# Patient Record
Sex: Female | Born: 2000 | Race: Black or African American | Hispanic: No | Marital: Single | Smoking: Never smoker
Health system: Southern US, Community
[De-identification: ages and names within clinical notes are randomized; demographics above are authoritative.]

## PROBLEM LIST (undated history)

## (undated) DIAGNOSIS — F419 Anxiety disorder, unspecified: Secondary | ICD-10-CM

## (undated) HISTORY — DX: Anxiety disorder, unspecified: F41.9

---
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
## 2020-04-07 IMAGING — DX DG ANKLE COMPLETE 3+V*L*
3 series · 3 of 3 positions shown · non-contrast
Comparison: None.

CLINICAL DATA: Pt c/o continuing left ankle pain and tenderness
since injury [REDACTED]. Pt denies any re injury or trauma to left ankle

EXAM:
LEFT ANKLE COMPLETE - 3+ VIEW

[ankle ap]
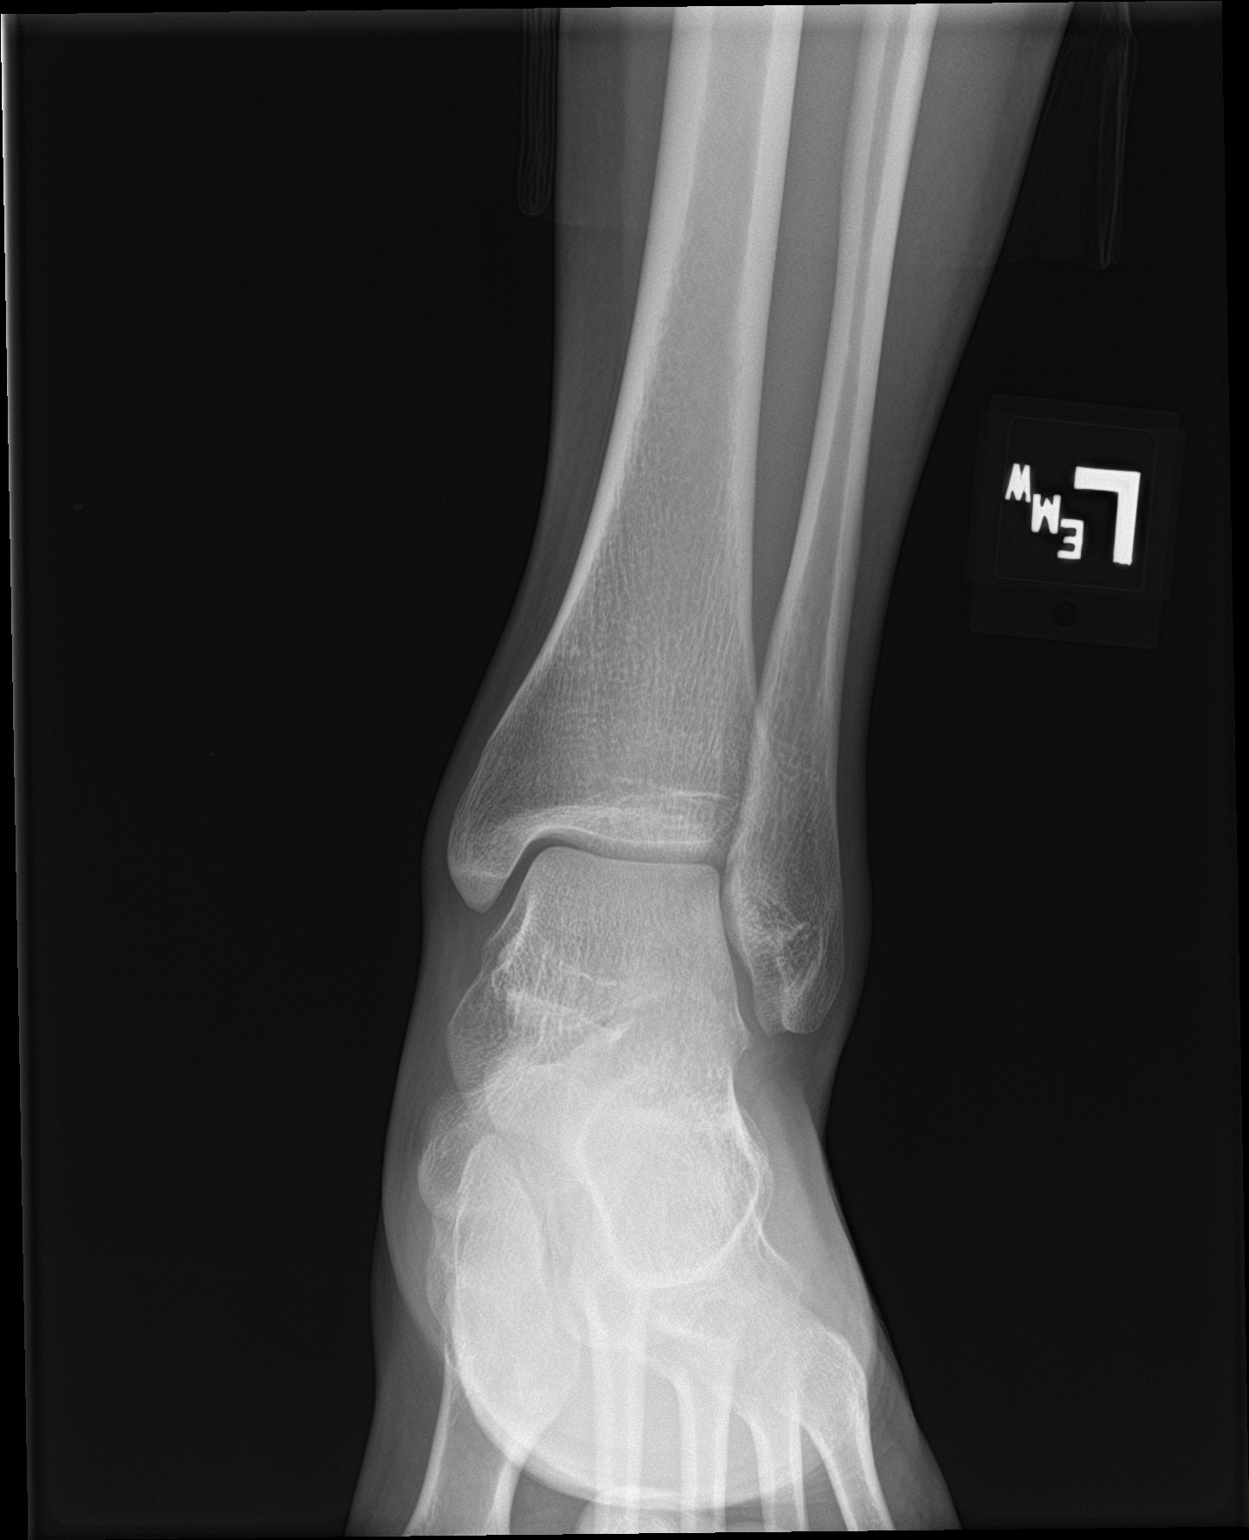

[ankle obl]
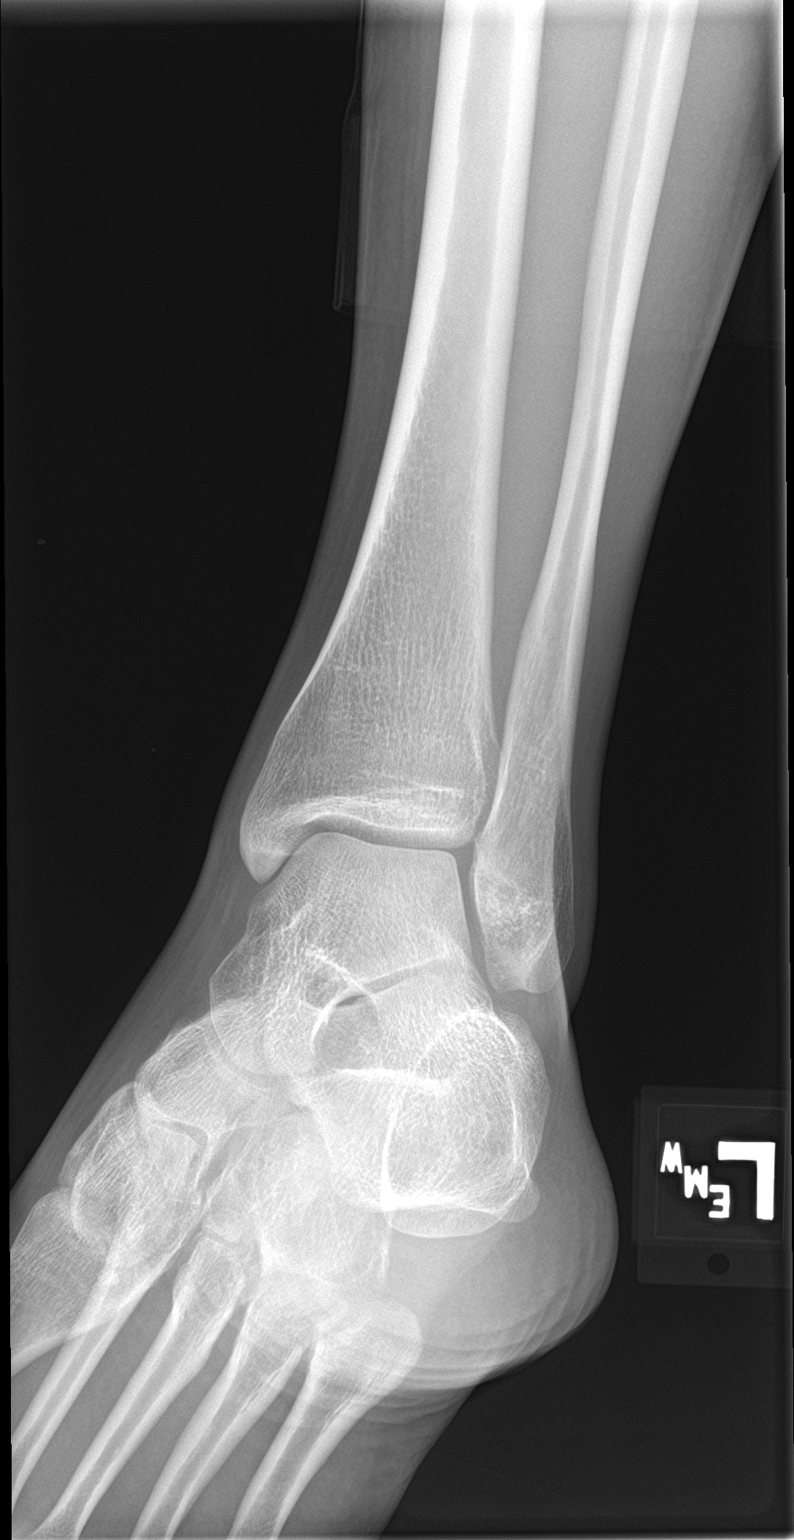

[ankle lat]
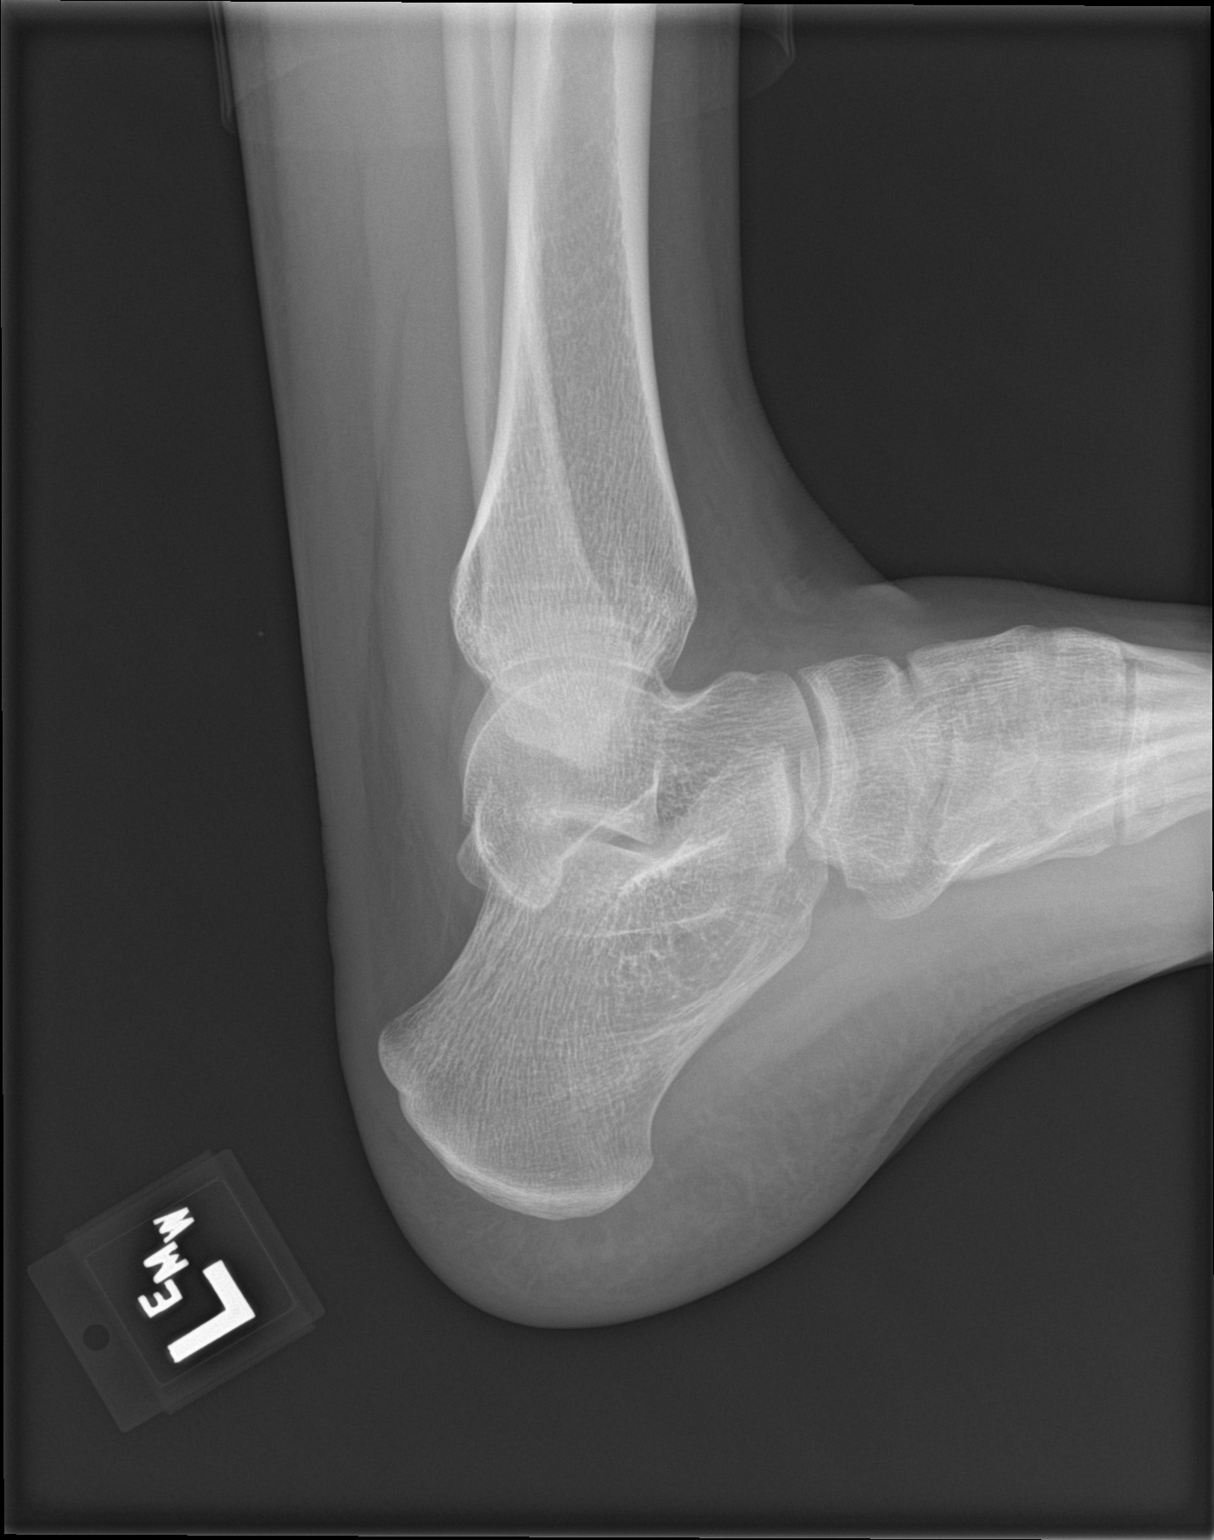

[3 of 3 positions shown; findings below may reference images not displayed]

FINDINGS: There is no evidence of fracture, dislocation, or joint effusion.
There is no evidence of arthropathy or other focal bone abnormality.
Soft tissues are unremarkable.
IMPRESSION: Negative.

## 2021-02-14 ENCOUNTER — Telehealth: Payer: Self-pay | Admitting: Sports Medicine

## 2021-02-14 MED ORDER — FLUCONAZOLE 150 MG PO TABS: 150.0000 mg | ORAL_TABLET | Freq: Once | ORAL | 3 refills | Status: AC

## 2021-02-14 NOTE — Telephone Encounter (Signed)
Patient's father called, patient supposedly with yeast infection, asking for Diflucan to be called in, I will send in this 1 time but they understand that this needs to be kept with her PCP unless they are established with [place].
